# Patient Record
Sex: Male | Born: 1965 | Race: White | Hispanic: No | Marital: Married | Smoking: Never smoker
Health system: Southern US, Community
[De-identification: ages and names within clinical notes are randomized; demographics above are authoritative.]

## PROBLEM LIST (undated history)

## (undated) DIAGNOSIS — I10 Essential (primary) hypertension: Secondary | ICD-10-CM

## (undated) DIAGNOSIS — Z85820 Personal history of malignant melanoma of skin: Secondary | ICD-10-CM

## (undated) DIAGNOSIS — E669 Obesity, unspecified: Secondary | ICD-10-CM

## (undated) HISTORY — DX: Essential (primary) hypertension: I10

## (undated) HISTORY — DX: Personal history of malignant melanoma of skin: Z85.820

## (undated) HISTORY — DX: Obesity, unspecified: E66.9

## (undated) HISTORY — PX: SKIN SURGERY: SHX2413

---

## 2008-02-08 ENCOUNTER — Encounter: Admission: RE | Admit: 2008-02-08 | Discharge: 2008-02-08 | Payer: Self-pay | Admitting: Neurosurgery

## 2008-03-01 ENCOUNTER — Encounter: Admission: RE | Admit: 2008-03-01 | Discharge: 2008-03-01 | Payer: Self-pay | Admitting: Neurosurgery

## 2008-08-02 ENCOUNTER — Encounter: Admission: RE | Admit: 2008-08-02 | Discharge: 2008-08-02 | Payer: Self-pay | Admitting: Neurosurgery

## 2016-05-07 DIAGNOSIS — R9431 Abnormal electrocardiogram [ECG] [EKG]: Secondary | ICD-10-CM

## 2016-05-07 DIAGNOSIS — E785 Hyperlipidemia, unspecified: Secondary | ICD-10-CM

## 2016-05-07 DIAGNOSIS — R001 Bradycardia, unspecified: Secondary | ICD-10-CM | POA: Insufficient documentation

## 2016-05-07 HISTORY — DX: Hyperlipidemia, unspecified: E78.5

## 2016-05-07 HISTORY — DX: Bradycardia, unspecified: R00.1

## 2016-05-07 HISTORY — DX: Abnormal electrocardiogram (ECG) (EKG): R94.31

## 2022-07-10 ENCOUNTER — Encounter: Payer: Self-pay | Admitting: Cardiology

## 2022-07-10 ENCOUNTER — Encounter: Payer: Self-pay | Admitting: *Deleted

## 2022-07-29 ENCOUNTER — Ambulatory Visit: Payer: BC Managed Care – PPO | Attending: Cardiology | Admitting: Cardiology

## 2022-07-29 ENCOUNTER — Encounter: Payer: Self-pay | Admitting: Cardiology

## 2022-07-29 VITALS — BP 110/80 | HR 63 | Ht 71.0 in | Wt 223.2 lb

## 2022-07-29 DIAGNOSIS — R001 Bradycardia, unspecified: Secondary | ICD-10-CM | POA: Diagnosis not present

## 2022-07-29 DIAGNOSIS — R0609 Other forms of dyspnea: Secondary | ICD-10-CM

## 2022-07-29 DIAGNOSIS — Z8249 Family history of ischemic heart disease and other diseases of the circulatory system: Secondary | ICD-10-CM

## 2022-07-29 DIAGNOSIS — E785 Hyperlipidemia, unspecified: Secondary | ICD-10-CM | POA: Diagnosis not present

## 2022-07-29 DIAGNOSIS — R9431 Abnormal electrocardiogram [ECG] [EKG]: Secondary | ICD-10-CM | POA: Diagnosis not present

## 2022-07-29 NOTE — Patient Instructions (Addendum)
Medication Instructions:  Your physician recommends that you continue on your current medications as directed. Please refer to the Current Medication list given to you today.  *If you need a refill on your cardiac medications before your next appointment, please call your pharmacy*   Lab Work: None Ordered If you have labs (blood work) drawn today and your tests are completely normal, you will receive your results only by: [place] (if you have MyChart) OR A paper copy in the mail If you have any lab test that is abnormal or we need to change your treatment, we will call you to review the results.   Testing/Procedures: Your physician has requested that you have an echocardiogram. Echocardiography is a painless test that uses sound waves to create images of your heart. It provides your doctor with information about the size and shape of your heart and how well your heart's chambers and valves are working. This procedure takes approximately one hour. There are no restrictions for this procedure.   We will order CT coronary calcium score. It will cost $99.00 and is not covered by insurance.  Please call to schedule.    [facility] [address] [place], [place] [address] [phone]     Follow-Up: At [facility], you and your health needs are our priority.  As part of our continuing mission to provide you with exceptional heart care, we have created designated Provider Care Teams.  These Care Teams include your primary Cardiologist (physician) and Advanced Practice Providers (APPs -  Physician Assistants and Nurse Practitioners) who all work together to provide you with the care you need, when you need it.  We recommend signing up for the patient portal called "MyChart".  Sign up information is provided on this After Visit Summary.  MyChart is used to connect with patients for Virtual Visits (Telemedicine).  Patients are able to view lab/test results, encounter  notes, upcoming appointments, etc.  Non-urgent messages can be sent to your provider as well.   To learn more about what you can do with MyChart, go to [email or web address].    Your next appointment:   3 month(s)  The format for your next appointment:   In Person  Provider:   Jenne Campus, MD    Other Instructions NA

## 2022-07-29 NOTE — Addendum Note (Signed)
Addended by: Jacobo Forest D on: 07/29/2022 02:31 PM   Modules accepted: Orders

## 2022-07-29 NOTE — Progress Notes (Signed)
Cardiology Consultation:    Date:  07/29/2022   ID:  Charles Stark, DOB 07/22/1966, MRN [id number]  PCP:  Charles Sheriff, MD  Cardiologist:  Charles Campus, MD   Referring MD: Charles Sheriff, MD   Chief Complaint  Patient presents with   heart disease prevention    History of Present Illness:    Charles Stark is a 56 y.o. male who is being seen today for the evaluation of multiple family members with coronary artery disease at the request of Charles Larsen II, MD. past medical history significant for sinus bradycardia, abnormal EKG with right bundle branch block, dyslipidemia.  I had a pleasure to meet him in 2017.  At that time he had a stress test done which was normal as well as echocardiogram.  He is concerned about the fact that multiple family members have premature coronary artery disease he will make sure he will avoid problems in the future he is asymptomatic.  He denies have any chest pain tightness squeezing pressure burning chest.  He exercised on the regular basis.  I have no difficulty doing it.  He is also trying to stick with good diet.  He does not smoke but had only few cigarettes in his life does not drink alcohol.  Past Medical History:  Diagnosis Date   Abnormal EKG 05/07/2016   Benign essential hypertension    Dyslipidemia 05/07/2016   History of melanoma    Followed by Dr. Ronnald Ramp in [facility]   Obesity    Sinus bradycardia 05/07/2016    Past Surgical History:  Procedure Laterality Date   SKIN SURGERY     Basal cell carcinoma excision    Current Medications: Current Meds  Medication Sig   Cholecalciferol (VITAMIN D3) 25 MCG (1000 UT) CAPS Take 1 capsule by mouth daily.   olmesartan (BENICAR) 20 MG tablet Take 10 mg by mouth daily.   rosuvastatin (CRESTOR) 10 MG tablet Take 5 mg by mouth at bedtime.     Allergies:   Patient has no known allergies.   Social History   Socioeconomic History   Marital status: Unknown    Spouse  name: Not on file   Number of children: Not on file   Years of education: Not on file   Highest education level: Not on file  Occupational History   Not on file  Tobacco Use   Smoking status: Never   Smokeless tobacco: Not on file  Substance and Sexual Activity   Alcohol use: Never   Drug use: Not on file   Sexual activity: Not on file  Other Topics Concern   Not on file  Social History Narrative   Not on file   Social Determinants of Health   Financial Resource Strain: Not on file  Food Insecurity: Not on file  Transportation Needs: Not on file  Physical Activity: Not on file  Stress: Not on file  Social Connections: Not on file     Family History: The patient's family history includes Coronary artery disease in his brother and father; Prostate cancer in his father. ROS:   Please see the history of present illness.    All 14 point review of systems negative except as described per history of present illness.  EKGs/Labs/Other Studies Reviewed:    The following studies were reviewed today:   EKG:  EKG is  ordered today.  The ekg ordered today demonstrates normal sinus rhythm normal P interval right bundle branch block  Recent Labs: No results found for requested labs within last 365 days.  Recent Lipid Panel No results found for: "CHOL", "TRIG", "HDL", "CHOLHDL", "VLDL", "LDLCALC", "LDLDIRECT"  Physical Exam:    VS:  BP 110/80 (BP Location: Left Arm, Patient Position: Sitting)   Pulse 63   Ht 5\' 11"  (1.803 m)   Wt 223 lb 3.2 oz (101.2 kg)   SpO2 95%   BMI 31.13 kg/m     Wt Readings from Last 3 Encounters:  07/29/22 223 lb 3.2 oz (101.2 kg)  04/08/22 229 lb (103.9 kg)     GEN:  Well nourished, well developed in no acute distress HEENT: Normal NECK: No JVD; No carotid bruits LYMPHATICS: No lymphadenopathy CARDIAC: RRR, no murmurs, no rubs, no gallops RESPIRATORY:  Clear to auscultation without rales, wheezing or rhonchi  ABDOMEN: Soft, non-tender,  non-distended MUSCULOSKELETAL:  No edema; No deformity  SKIN: Warm and dry NEUROLOGIC:  Alert and oriented x 3 PSYCHIATRIC:  Normal affect   ASSESSMENT:    1. Sinus bradycardia   2. Family history of coronary artery disease   3. Abnormal EKG   4. Dyslipidemia    PLAN:    In order of problems listed above:  Family history of premature coronary artery disease.  His cholesterol is excellently managed by his primary care physician I do have his K PN which show me his total cholesterol 145, LDL 54 and HDL 37.  He is taking Crestor 5 which is moderate intense statin which I will continue.  I will ask him to have a calcium score which help me to determine his prognosis and how aggressive we need to be with the management. Abnormal EKG with right bundle branch block I will do echocardiogram make sure her right ventricle is normal in size and function.  He tells me that he snores some however does not stop breathing at night but ask him to talk with to his wife to make sure that this is exactly what the case is With great of time talking about healthy lifestyle we did talk about Mediterranean diet need to exercise on the regular basis which she already does.   Medication Adjustments/Labs and Tests Ordered: Current medicines are reviewed at length with the patient today.  Concerns regarding medicines are outlined above.  No orders of the defined types were placed in this encounter.  No orders of the defined types were placed in this encounter.   Signed, 04/10/22, MD, [facility]. 07/29/2022 2:24 PM    [facility]

## 2022-07-31 ENCOUNTER — Ambulatory Visit (HOSPITAL_BASED_OUTPATIENT_CLINIC_OR_DEPARTMENT_OTHER)
Admission: RE | Admit: 2022-07-31 | Discharge: 2022-07-31 | Disposition: A | Discharge status: Home / Self Care | Payer: BC Managed Care – PPO | Source: Ambulatory Visit | Attending: Cardiology | Admitting: Cardiology

## 2022-07-31 DIAGNOSIS — Z8249 Family history of ischemic heart disease and other diseases of the circulatory system: Secondary | ICD-10-CM | POA: Insufficient documentation

## 2022-08-07 ENCOUNTER — Telehealth: Payer: Self-pay | Admitting: Cardiology

## 2022-08-07 ENCOUNTER — Telehealth: Payer: Self-pay

## 2022-08-07 NOTE — Telephone Encounter (Signed)
Patient was returning call. Please advise ?

## 2022-08-07 NOTE — Telephone Encounter (Signed)
Results reviewed with pt as per Dr. Krasowski's note.  Pt verbalized understanding and had no additional questions. Routed to PCP  

## 2022-08-12 ENCOUNTER — Ambulatory Visit: Payer: BC Managed Care – PPO | Attending: Cardiology

## 2022-08-12 DIAGNOSIS — R0609 Other forms of dyspnea: Secondary | ICD-10-CM

## 2022-08-12 DIAGNOSIS — I517 Cardiomegaly: Secondary | ICD-10-CM

## 2022-08-12 LAB — ECHOCARDIOGRAM COMPLETE
Area-P 1/2: 2.68 cm2
S' Lateral: 2.7 cm

## 2022-08-14 ENCOUNTER — Telehealth: Payer: Self-pay

## 2022-08-14 NOTE — Telephone Encounter (Signed)
-----   Message from Park Liter, MD sent at 08/13/2022 12:55 PM EDT ----- Echocardiogram showed preserved left ventricle ejection fraction overall looks good

## 2022-08-14 NOTE — Telephone Encounter (Signed)
Patient notified of results.

## 2022-10-28 ENCOUNTER — Encounter: Payer: Self-pay | Admitting: Cardiology

## 2022-10-28 ENCOUNTER — Ambulatory Visit: Payer: BC Managed Care – PPO | Attending: Cardiology | Admitting: Cardiology

## 2022-10-28 VITALS — BP 140/98 | HR 52 | Ht 71.0 in | Wt 225.8 lb

## 2022-10-28 DIAGNOSIS — E785 Hyperlipidemia, unspecified: Secondary | ICD-10-CM | POA: Diagnosis not present

## 2022-10-28 DIAGNOSIS — Z8249 Family history of ischemic heart disease and other diseases of the circulatory system: Secondary | ICD-10-CM | POA: Diagnosis not present

## 2022-10-28 DIAGNOSIS — R931 Abnormal findings on diagnostic imaging of heart and coronary circulation: Secondary | ICD-10-CM | POA: Diagnosis not present

## 2022-10-28 MED ORDER — ASPIRIN 81 MG PO TBEC: 81.0000 mg | DELAYED_RELEASE_TABLET | Freq: Every day | ORAL | 3 refills | Status: AC

## 2022-10-28 NOTE — Addendum Note (Signed)
Addended by: Baldo Ash D on: 10/28/2022 03:32 PM   Modules accepted: Orders

## 2022-10-28 NOTE — Progress Notes (Signed)
Cardiology Office Note:    Date:  10/28/2022   ID:  [name], DOB 1966/02/02, MRN [id number]  PCP:  Angelina Sheriff, MD  Cardiologist:  Jenne Campus, MD    Referring MD: Angelina Sheriff, MD   No chief complaint on file.   History of Present Illness:    Charles Stark is a 56 y.o. male with past medical history significant for essential hypertension, he was referred to [place] because a family member had some coronary events.  Past medical history included essential hypertension, dyslipidemia.  He did have coronary calcium score which was 278 which is 87 percentile for his age race and sex, he comes today to my office discussed this. Since I have met him first time we talk about healthy lifestyle need to exercise and regular basis we discussed basic of Mediterranean diet, he already make significant changes to accommodate diet and seems to be doing quite well from that point review.  Of course he is very concerned about his calcium score but at the same time he is very happy because out of his 3 brothers he got lower score.  Past Medical History:  Diagnosis Date   Abnormal EKG 05/07/2016   Benign essential hypertension    Dyslipidemia 05/07/2016   History of melanoma    Followed by Dr. Ronnald Ramp in [facility]   Obesity    Sinus bradycardia 05/07/2016    Past Surgical History:  Procedure Laterality Date   SKIN SURGERY     Basal cell carcinoma excision    Current Medications: Current Meds  Medication Sig   Cholecalciferol (VITAMIN D3) 25 MCG (1000 UT) CAPS Take 1 capsule by mouth daily.   olmesartan (BENICAR) 20 MG tablet Take 10 mg by mouth daily.   rosuvastatin (CRESTOR) 10 MG tablet Take 5 mg by mouth at bedtime.     Allergies:   Patient has no known allergies.   Social History   Socioeconomic History   Marital status: Married    Spouse name: Not on file   Number of children: Not on file   Years of education: Not on file   Highest education level: Not  on file  Occupational History   Not on file  Tobacco Use   Smoking status: Never   Smokeless tobacco: Not on file  Substance and Sexual Activity   Alcohol use: Never   Drug use: Not on file   Sexual activity: Not on file  Other Topics Concern   Not on file  Social History Narrative   Not on file   Social Determinants of Health   Financial Resource Strain: Not on file  Food Insecurity: Not on file  Transportation Needs: Not on file  Physical Activity: Not on file  Stress: Not on file  Social Connections: Not on file     Family History: The patient's family history includes Coronary artery disease in his brother and father; Prostate cancer in his father. ROS:   Please see the history of present illness.    All 14 point review of systems negative except as described per history of present illness  EKGs/Labs/Other Studies Reviewed:      Recent Labs: No results found for requested labs within last 365 days.  Recent Lipid Panel No results found for: "CHOL", "TRIG", "HDL", "CHOLHDL", "VLDL", "LDLCALC", "LDLDIRECT"  Physical Exam:    VS:  BP (!) 140/98   Pulse (!) 52   Ht _0  (1.803 m)   Wt 225 lb  12.8 oz (102.4 kg)   SpO2 96%   BMI 31.49 kg/m     Wt Readings from Last 3 Encounters:  10/28/22 225 lb 12.8 oz (102.4 kg)  07/29/22 223 lb 3.2 oz (101.2 kg)  04/08/22 229 lb (103.9 kg)     GEN:  Well nourished, well developed in no acute distress HEENT: Normal NECK: No JVD; No carotid bruits LYMPHATICS: No lymphadenopathy CARDIAC: RRR, no murmurs, no rubs, no gallops RESPIRATORY:  Clear to auscultation without rales, wheezing or rhonchi  ABDOMEN: Soft, non-tender, non-distended MUSCULOSKELETAL:  No edema; No deformity  SKIN: Warm and dry LOWER EXTREMITIES: no swelling NEUROLOGIC:  Alert and oriented x 3 PSYCHIATRIC:  Normal affect   ASSESSMENT:    1. Agatston coronary artery calcium score between 100 and 400   2. Dyslipidemia   3. Family history of  coronary artery disease    PLAN:    In order of problems listed above:  Elevated calcium score completely asymptomatic exercise on the regular basis.  Ask him to start taking baby aspirin. Essential hypertension uncontrolled today but he admits that for last few days he forgot to take his medications.  I have stressed importance of taking medication and I told him taking blood pressure medications absolutely essential to keep him out of trouble. Family history of coronary artery disease.  Noted I suspect he will do very well as long as he comply with my advices.  Today we will just at aspirin to his medical regimen.   Medication Adjustments/Labs and Tests Ordered: Current medicines are reviewed at length with the patient today.  Concerns regarding medicines are outlined above.  No orders of the defined types were placed in this encounter.  Medication changes: No orders of the defined types were placed in this encounter.   Signed, Park Liter, MD, [facility] 10/28/2022 3:20 PM    [place]

## 2022-10-28 NOTE — Patient Instructions (Signed)
Medication Instructions:   START: Aspirin enteric coated 81mg  1 tablet daily   Lab Work: Your physician recommends that you return for lab work in: 6 weeks You need to have labs done when you are fasting.  You can come Monday through Friday 8:30 am to 12:00 pm and 1:15 to 4:30. You do not need to make an appointment as the order has already been placed. The labs you are going to have done are AST, ALT and Lipids.    Testing/Procedures: None Ordered   Follow-Up: At [facility], you and your health needs are our priority.  As part of our continuing mission to provide you with exceptional heart care, we have created designated Provider Care Teams.  These Care Teams include your primary Cardiologist (physician) and Advanced Practice Providers (APPs -  Physician Assistants and Nurse Practitioners) who all work together to provide you with the care you need, when you need it.  We recommend signing up for the patient portal called "MyChart".  Sign up information is provided on this After Visit Summary.  MyChart is used to connect with patients for Virtual Visits (Telemedicine).  Patients are able to view lab/test results, encounter notes, upcoming appointments, etc.  Non-urgent messages can be sent to your provider as well.   To learn more about what you can do with MyChart, go to [facility].    Your next appointment:   12 month(s)  The format for your next appointment:   In Person  Provider:   [email or web address], MD    Other Instructions NA

## 2022-12-10 LAB — ALT: ALT: 26 IU/L (ref 0–44)

## 2022-12-10 LAB — AST: AST: 20 IU/L (ref 0–40)

## 2022-12-10 LAB — LIPID PANEL
Chol/HDL Ratio: 2.8 ratio (ref 0.0–5.0)
Cholesterol, Total: 138 mg/dL (ref 100–199)
HDL: 50 mg/dL (ref >39)
LDL Chol Calc (NIH): 74 mg/dL (ref 0–99)
Triglycerides: 72 mg/dL (ref 0–149)
VLDL Cholesterol Cal: 14 mg/dL (ref 5–40)

## 2022-12-18 ENCOUNTER — Telehealth: Payer: Self-pay

## 2022-12-18 NOTE — Telephone Encounter (Signed)
Results reviewed with pt as per Dr. Krasowski's note.  Pt verbalized understanding and had no additional questions. Routed to PCP  

## 2023-11-03 ENCOUNTER — Ambulatory Visit: Payer: BC Managed Care – PPO | Attending: Cardiology | Admitting: Cardiology

## 2023-11-03 ENCOUNTER — Encounter: Payer: Self-pay | Admitting: Cardiology

## 2023-11-03 VITALS — BP 142/72 | HR 46 | Ht 71.0 in | Wt 215.4 lb

## 2023-11-03 DIAGNOSIS — Z8249 Family history of ischemic heart disease and other diseases of the circulatory system: Secondary | ICD-10-CM | POA: Diagnosis not present

## 2023-11-03 DIAGNOSIS — E785 Hyperlipidemia, unspecified: Secondary | ICD-10-CM

## 2023-11-03 DIAGNOSIS — R0609 Other forms of dyspnea: Secondary | ICD-10-CM | POA: Diagnosis not present

## 2023-11-03 DIAGNOSIS — R931 Abnormal findings on diagnostic imaging of heart and coronary circulation: Secondary | ICD-10-CM

## 2023-11-03 DIAGNOSIS — R001 Bradycardia, unspecified: Secondary | ICD-10-CM

## 2023-11-03 MED ORDER — ROSUVASTATIN CALCIUM 10 MG PO TABS: 5.0000 mg | ORAL_TABLET | Freq: Every day | ORAL | 3 refills | Status: AC

## 2023-11-03 NOTE — Addendum Note (Signed)
Addended by: Baldo Ash D on: 11/03/2023 09:02 AM   Modules accepted: Orders

## 2023-11-03 NOTE — Patient Instructions (Signed)
Medication Instructions:  Your physician recommends that you continue on your current medications as directed. Please refer to the Current Medication list given to you today.  *If you need a refill on your cardiac medications before your next appointment, please call your pharmacy*   Lab Work: Lipid, AST, ALT, Lpa- today If you have labs (blood work) drawn today and your tests are completely normal, you will receive your results only by: [place] (if you have MyChart) OR A paper copy in the mail If you have any lab test that is abnormal or we need to change your treatment, we will call you to review the results.   Testing/Procedures: None Ordered   Follow-Up: At [facility], you and your health needs are our priority.  As part of our continuing mission to provide you with exceptional heart care, we have created designated Provider Care Teams.  These Care Teams include your primary Cardiologist (physician) and Advanced Practice Providers (APPs -  Physician Assistants and Nurse Practitioners) who all work together to provide you with the care you need, when you need it.  We recommend signing up for the patient portal called "MyChart".  Sign up information is provided on this After Visit Summary.  MyChart is used to connect with patients for Virtual Visits (Telemedicine).  Patients are able to view lab/test results, encounter notes, upcoming appointments, etc.  Non-urgent messages can be sent to your provider as well.   To learn more about what you can do with MyChart, go to [email or web address].    Your next appointment:   12 month(s)  The format for your next appointment:   In Person  Provider:   Jenne Campus, MD    Other Instructions NA

## 2023-11-03 NOTE — Progress Notes (Signed)
Cardiology Office Note:    Date:  11/03/2023   ID:  Charles Stark, DOB 1966/08/20, MRN [id number]  PCP:  Noni Saupe, MD  Cardiologist:  Gypsy Balsam, MD    Referring MD: Noni Saupe, MD   Chief Complaint  Patient presents with   Medication Refill    History of Present Illness:    Charles Stark is a 57 y.o. male past medical history significant for essential hypertension, coronary calcium score 278 which is 87% total, dyslipidemia.  Comes for yearly follow-up overall doing great.  He exercised on the regular basis he does do CrossFit doing very well.  Lost significant amount of weight blood pressure is fine without medications now.  Denies have any chest pain tightness squeezing pressure burning chest no dizziness no passing out.  Past Medical History:  Diagnosis Date   Abnormal EKG 05/07/2016   Benign essential hypertension    Dyslipidemia 05/07/2016   History of melanoma    Followed by Dr. Yetta Barre in [facility]   Obesity    Sinus bradycardia 05/07/2016    Past Surgical History:  Procedure Laterality Date   SKIN SURGERY     Basal cell carcinoma excision    Current Medications: Current Meds  Medication Sig   aspirin EC 81 MG tablet Take 1 tablet (81 mg total) by mouth daily. Swallow whole.   Cholecalciferol (VITAMIN D3) 25 MCG (1000 UT) CAPS Take 1 capsule by mouth daily.   olmesartan (BENICAR) 20 MG tablet Take 10 mg by mouth daily.   rosuvastatin (CRESTOR) 10 MG tablet Take 5 mg by mouth at bedtime.     Allergies:   Patient has no known allergies.   Social History   Socioeconomic History   Marital status: Married    Spouse name: Not on file   Number of children: Not on file   Years of education: Not on file   Highest education level: Not on file  Occupational History   Not on file  Tobacco Use   Smoking status: Never   Smokeless tobacco: Never  Substance and Sexual Activity   Alcohol use: Never   Drug use: Never   Sexual  activity: Not on file  Other Topics Concern   Not on file  Social History Narrative   Not on file   Social Drivers of Health   Financial Resource Strain: Not on file  Food Insecurity: Not on file  Transportation Needs: Not on file  Physical Activity: Not on file  Stress: Not on file  Social Connections: Not on file     Family History: The patient's family history includes Coronary artery disease in his brother and father; Prostate cancer in his father. ROS:   Please see the history of present illness.    All 14 point review of systems negative except as described per history of present illness  EKGs/Labs/Other Studies Reviewed:         Recent Labs: 12/09/2022: ALT 26  Recent Lipid Panel    Component Value Date/Time   CHOL 138 12/09/2022 0000   TRIG 72 12/09/2022 0000   HDL 50 12/09/2022 0000   CHOLHDL 2.8 12/09/2022 0000   LDLCALC 74 12/09/2022 0000    Physical Exam:    VS:  BP (!) 142/72 (BP Location: Left Arm, Patient Position: Sitting)   Pulse (!) 46   Ht 5\' 11"  (1.803 m)   Wt 215 lb 6.4 oz (97.7 kg)   SpO2 99%   BMI 30.04 kg/m  Wt Readings from Last 3 Encounters:  11/03/23 215 lb 6.4 oz (97.7 kg)  10/28/22 225 lb 12.8 oz (102.4 kg)  07/29/22 223 lb 3.2 oz (101.2 kg)     GEN:  Well nourished, well developed in no acute distress HEENT: Normal NECK: No JVD; No carotid bruits LYMPHATICS: No lymphadenopathy CARDIAC: RRR, no murmurs, no rubs, no gallops RESPIRATORY:  Clear to auscultation without rales, wheezing or rhonchi  ABDOMEN: Soft, non-tender, non-distended MUSCULOSKELETAL:  No edema; No deformity  SKIN: Warm and dry LOWER EXTREMITIES: no swelling NEUROLOGIC:  Alert and oriented x 3 PSYCHIATRIC:  Normal affect   ASSESSMENT:    1. Dyspnea on exertion   2. 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 Dyslipidemia   4. Family history of coronary artery disease   5. Sinus bradycardia    PLAN:    In order of problems  listed above:  Elevated calcium score asymptomatic.  Will check fasting lipid profile AST ALT as well as LP(a). Essential hypertension he checks his blood pressure at home 120/70 we will continue monitoring.  I think the reason why his blood pressure normalized is the fact that he exercised on the regular basis and it right.  Will continue monitoring. Sinus bradycardia asymptomatic no chest pain tightness squeezing pressure burning chest no dizziness no passing out.   Medication Adjustments/Labs and Tests Ordered: Current medicines are reviewed at length with the patient today.  Concerns regarding medicines are outlined above.  Orders Placed This Encounter  Procedures   EKG 12-Lead   Medication changes: No orders of the defined types were placed in this encounter.   Signed, Georgeanna Lea, MD, [facility] 11/03/2023 8:46 AM    [facility]

## 2023-11-05 LAB — LIPID PANEL
Chol/HDL Ratio: 2.8 {ratio} (ref 0.0–5.0)
Cholesterol, Total: 155 mg/dL (ref 100–199)
HDL: 56 mg/dL (ref >39)
LDL Chol Calc (NIH): 83 mg/dL (ref 0–99)
Triglycerides: 87 mg/dL (ref 0–149)
VLDL Cholesterol Cal: 16 mg/dL (ref 5–40)

## 2023-11-05 LAB — AST: AST: 51 [IU]/L — ABNORMAL HIGH (ref 0–40)

## 2023-11-05 LAB — ALT: ALT: 33 [IU]/L (ref 0–44)

## 2023-11-05 LAB — LIPOPROTEIN A (LPA): Lipoprotein (a): 17.8 nmol/L (ref <75.0)

## 2023-11-25 ENCOUNTER — Telehealth: Payer: Self-pay

## 2023-11-25 NOTE — Telephone Encounter (Signed)
 Left message on My Chart with lab results per Dr. Vanetta Shawl note. Routed to PCP.

## 2024-04-22 ENCOUNTER — Ambulatory Visit: Payer: Self-pay | Attending: Cardiology | Admitting: Cardiology

## 2024-04-22 ENCOUNTER — Encounter: Payer: Self-pay | Admitting: Cardiology

## 2024-04-22 VITALS — BP 130/80 | HR 96 | Ht 71.0 in | Wt 218.0 lb

## 2024-04-22 DIAGNOSIS — R001 Bradycardia, unspecified: Secondary | ICD-10-CM | POA: Diagnosis not present

## 2024-04-22 DIAGNOSIS — E785 Hyperlipidemia, unspecified: Secondary | ICD-10-CM | POA: Diagnosis not present

## 2024-04-22 DIAGNOSIS — R931 Abnormal findings on diagnostic imaging of heart and coronary circulation: Secondary | ICD-10-CM | POA: Diagnosis not present

## 2024-04-22 DIAGNOSIS — I10 Essential (primary) hypertension: Secondary | ICD-10-CM | POA: Insufficient documentation

## 2024-04-22 NOTE — Addendum Note (Signed)
 Addended by: Shawnee Dellen D on: 04/22/2024 02:41 PM   Modules accepted: Orders

## 2024-04-22 NOTE — Patient Instructions (Signed)
 Medication Instructions:  Your physician recommends that you continue on your current medications as directed. Please refer to the Current Medication list given to you today.  *If you need a refill on your cardiac medications before your next appointment, please call your pharmacy*   Lab Work: None Ordered If you have labs (blood work) drawn today and your tests are completely normal, you will receive your results only by: MyChart Message (if you have MyChart) OR A paper copy in the mail If you have any lab test that is abnormal or we need to change your treatment, we will call you to review the results.   Testing/Procedures: 24 hr BP monitor   Follow-Up: At [facility], you and your health needs are our priority.  As part of our continuing mission to provide you with exceptional heart care, we have created designated Provider Care Teams.  These Care Teams include your primary Cardiologist (physician) and Advanced Practice Providers (APPs -  Physician Assistants and Nurse Practitioners) who all work together to provide you with the care you need, when you need it.  We recommend signing up for the patient portal called MyChart.  Sign up information is provided on this After Visit Summary.  MyChart is used to connect with patients for Virtual Visits (Telemedicine).  Patients are able to view lab/test results, encounter notes, upcoming appointments, etc.  Non-urgent messages can be sent to your provider as well.   To learn more about what you can do with MyChart, go to [email or web address].    Your next appointment:   6 month(s)  The format for your next appointment:   In Person  Provider:   Ralene Burger, MD    Other Instructions NA

## 2024-04-22 NOTE — Progress Notes (Signed)
 Cardiology Office Note:    Date:  04/22/2024   ID:  Charles Stark, DOB Jan 09, 1966, MRN [id number]  PCP:  Madelyne Schiff, MD  Cardiologist:  Ralene Burger, MD    Referring MD: Madelyne Schiff, MD   Chief Complaint  Patient presents with   Blood Pressure Check    History of Present Illness:    Charles Stark is a 58 y.o. male past medical history significant for essential hypertension, calcium  278 which is 87%, dyslipidemia.  Comes today to my office concerns about high blood pressure last Wednesday he worked very hard he was very nervous recheck his blood pressure 190/110.  He became very nervous after that.  Since that time he checked his blood pressure at home usually 1 4150 systolic.  Denies have any symptoms.  He goes to gym on the regular basis and exercise aggressively with no issues.  He said he is much better shape now than he was before  Past Medical History:  Diagnosis Date   Abnormal EKG 05/07/2016   Benign essential hypertension    Dyslipidemia 05/07/2016   History of melanoma    Followed by Dr. Rochelle Chu in [facility]   Obesity    Sinus bradycardia 05/07/2016    Past Surgical History:  Procedure Laterality Date   SKIN SURGERY     Basal cell carcinoma excision    Current Medications: Current Meds  Medication Sig   aspirin  EC 81 MG tablet Take 1 tablet (81 mg total) by mouth daily. Swallow whole.   Cholecalciferol (VITAMIN D3) 25 MCG (1000 UT) CAPS Take 1 capsule by mouth daily.   rosuvastatin  (CRESTOR ) 10 MG tablet Take 0.5 tablets (5 mg total) by mouth at bedtime.     Allergies:   Patient has no known allergies.   Social History   Socioeconomic History   Marital status: Married    Spouse name: Not on file   Number of children: Not on file   Years of education: Not on file   Highest education level: Not on file  Occupational History   Not on file  Tobacco Use   Smoking status: Never   Smokeless tobacco: Never  Substance and Sexual  Activity   Alcohol use: Never   Drug use: Never   Sexual activity: Not on file  Other Topics Concern   Not on file  Social History Narrative   Not on file   Social Drivers of Health   Financial Resource Strain: Not on file  Food Insecurity: Not on file  Transportation Needs: Not on file  Physical Activity: Not on file  Stress: Not on file  Social Connections: Not on file     Family History: The patient's family history includes Coronary artery disease in his brother and father; Prostate cancer in his father. ROS:   Please see the history of present illness.    All 14 point review of systems negative except as described per history of present illness  EKGs/Labs/Other Studies Reviewed:         Recent Labs: 11/03/2023: ALT 33  Recent Lipid Panel    Component Value Date/Time   CHOL 155 11/03/2023 0905   TRIG 87 11/03/2023 0905   HDL 56 11/03/2023 0905   CHOLHDL 2.8 11/03/2023 0905   LDLCALC 83 11/03/2023 0905    Physical Exam:    VS:  BP 130/80 (BP Location: Right Arm, Patient Position: Sitting)   Pulse 96   Ht 5' 11 (1.803 m)  Wt 218 lb (98.9 kg)   SpO2 96%   BMI 30.40 kg/m     Wt Readings from Last 3 Encounters:  04/22/24 218 lb (98.9 kg)  11/03/23 215 lb 6.4 oz (97.7 kg)  10/28/22 225 lb 12.8 oz (102.4 kg)     GEN:  Well nourished, well developed in no acute distress HEENT: Normal NECK: No JVD; No carotid bruits LYMPHATICS: No lymphadenopathy CARDIAC: RRR, no murmurs, no rubs, no gallops RESPIRATORY:  Clear to auscultation without rales, wheezing or rhonchi  ABDOMEN: Soft, non-tender, non-distended MUSCULOSKELETAL:  No edema; No deformity  SKIN: Warm and dry LOWER EXTREMITIES: no swelling NEUROLOGIC:  Alert and oriented x 3 PSYCHIATRIC:  Normal affect   ASSESSMENT:    1. Essential hypertension   2. Sinus bradycardia   3. Dyslipidemia   4. 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  Will put 24 hours blood pressure monitor to see fluctuation of the blood pressure and whether he truly or the blood pressure is.  I will not alter any medications.  If anything needs to be done probably olmesartan increase will take care of it. Sinus bradycardia asymptomatic no dizziness no passing out most like related to the fact that he exercised aggressively which I recommend to continue. Dyslipidemia I did review K PN show me LDL 83 HDL 56 and he is on Crestor  10 which I will continue   Medication Adjustments/Labs and Tests Ordered: Current medicines are reviewed at length with the patient today.  Concerns regarding medicines are outlined above.  No orders of the defined types were placed in this encounter.  Medication changes: No orders of the defined types were placed in this encounter.   Signed, Manfred Seed, MD, [facility] 04/22/2024 2:22 PM    [facility]

## 2024-04-26 ENCOUNTER — Telehealth: Payer: Self-pay

## 2024-04-26 NOTE — Telephone Encounter (Signed)
 BP monitor schedule on 06/19 at 8am, aware to bring a belt.

## 2024-04-28 ENCOUNTER — Ambulatory Visit: Attending: Cardiology

## 2024-04-28 DIAGNOSIS — I10 Essential (primary) hypertension: Secondary | ICD-10-CM

## 2024-06-21 DIAGNOSIS — I10 Essential (primary) hypertension: Secondary | ICD-10-CM | POA: Diagnosis not present

## 2024-06-23 ENCOUNTER — Ambulatory Visit: Payer: Self-pay | Admitting: Cardiology

## 2024-06-27 ENCOUNTER — Other Ambulatory Visit: Payer: Self-pay

## 2024-06-27 MED ORDER — OLMESARTAN MEDOXOMIL 40 MG PO TABS: 40.0000 mg | ORAL_TABLET | Freq: Every day | ORAL | 3 refills | Status: AC

## 2024-06-27 NOTE — Telephone Encounter (Signed)
 Increase Benicar  to 40mg  per Dr. Karry note.

## 2024-07-05 ENCOUNTER — Telehealth: Payer: Self-pay

## 2024-07-05 DIAGNOSIS — I10 Essential (primary) hypertension: Secondary | ICD-10-CM

## 2024-07-05 NOTE — Telephone Encounter (Signed)
 BMP per Dr. Vanetta Shawl note

## 2024-07-06 LAB — BASIC METABOLIC PANEL WITH GFR
BUN/Creatinine Ratio: 13 (ref 10–24)
BUN: 12 mg/dL (ref 8–27)
CO2: 21 mmol/L (ref 20–29)
Calcium: 9.2 mg/dL (ref 8.6–10.2)
Chloride: 103 mmol/L (ref 96–106)
Creatinine, Ser: 0.95 mg/dL (ref 0.76–1.27)
Glucose: 91 mg/dL (ref 70–99)
Potassium: 4.1 mmol/L (ref 3.5–5.2)
Sodium: 141 mmol/L (ref 134–144)
eGFR: 92 mL/min/1.73 (ref >59)

## 2024-07-15 ENCOUNTER — Ambulatory Visit: Payer: Self-pay | Admitting: Cardiology

## 2024-07-20 ENCOUNTER — Telehealth: Payer: Self-pay

## 2024-07-20 NOTE — Telephone Encounter (Signed)
 Left message on My Chart with lab results per Dr. Karry note. Routed to PCP

## 2024-07-20 NOTE — Telephone Encounter (Signed)
 Pt viewed lab results on My Chart per Dr. Karry note. Routed to PCP.
